# Patient Record
Sex: Female | Born: 2000 | Race: White | Hispanic: No | Marital: Single | Smoking: Never smoker
Health system: Southern US, Community
[De-identification: ages and names within clinical notes are randomized; demographics above are authoritative.]

## PROBLEM LIST (undated history)

## (undated) DIAGNOSIS — K219 Gastro-esophageal reflux disease without esophagitis: Secondary | ICD-10-CM

## (undated) DIAGNOSIS — Z789 Other specified health status: Secondary | ICD-10-CM

## (undated) DIAGNOSIS — Z87442 Personal history of urinary calculi: Secondary | ICD-10-CM

## (undated) HISTORY — PX: NO PAST SURGERIES: SHX2092

---
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
## 2023-06-04 ENCOUNTER — Emergency Department (HOSPITAL_COMMUNITY)
Admission: EM | Admit: 2023-06-04 | Discharge: 2023-06-05 | Disposition: A | Discharge status: Home / Self Care | Payer: Medicaid Other | Attending: Emergency Medicine | Admitting: Emergency Medicine

## 2023-06-04 ENCOUNTER — Encounter (HOSPITAL_COMMUNITY): Payer: Self-pay

## 2023-06-04 ENCOUNTER — Emergency Department (HOSPITAL_COMMUNITY): Payer: Medicaid Other

## 2023-06-04 ENCOUNTER — Other Ambulatory Visit: Payer: Self-pay

## 2023-06-04 DIAGNOSIS — R109 Unspecified abdominal pain: Secondary | ICD-10-CM | POA: Diagnosis not present

## 2023-06-04 DIAGNOSIS — S40212A Abrasion of left shoulder, initial encounter: Secondary | ICD-10-CM | POA: Insufficient documentation

## 2023-06-04 DIAGNOSIS — Y92410 Unspecified street and highway as the place of occurrence of the external cause: Secondary | ICD-10-CM | POA: Diagnosis not present

## 2023-06-04 DIAGNOSIS — S40912A Unspecified superficial injury of left shoulder, initial encounter: Secondary | ICD-10-CM | POA: Diagnosis present

## 2023-06-04 DIAGNOSIS — S60512A Abrasion of left hand, initial encounter: Secondary | ICD-10-CM | POA: Diagnosis not present

## 2023-06-04 LAB — I-STAT CREATININE, ED: Creatinine, Ser: 0.7 mg/dL (ref 0.44–1.00)

## 2023-06-04 LAB — HCG, SERUM, QUALITATIVE: Preg, Serum: NEGATIVE
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
## 2023-06-05 MED ORDER — CYCLOBENZAPRINE HCL 10 MG PO TABS: 10.0000 mg | ORAL_TABLET | Freq: Three times a day (TID) | ORAL | 0 refills | Status: DC | PRN

## 2023-06-05 MED ORDER — IBUPROFEN 600 MG PO TABS: 600.0000 mg | ORAL_TABLET | Freq: Four times a day (QID) | ORAL | 0 refills | Status: DC | PRN

## 2023-06-05 MED ORDER — IOHEXOL 350 MG/ML SOLN
75.0000 mL | Freq: Once | INTRAVENOUS | Status: AC | PRN
  Administered 2023-06-05: 75 mL via INTRAVENOUS
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
## 2024-05-05 ENCOUNTER — Ambulatory Visit
Admission: RE | Admit: 2024-05-05 | Discharge: 2024-05-05 | Disposition: A | Discharge status: Home / Self Care | Source: Ambulatory Visit | Attending: Nurse Practitioner | Admitting: Nurse Practitioner

## 2024-05-05 VITALS — BP 147/84 | HR 70 | Temp 98.7°F | Resp 18 | Wt 184.5 lb

## 2024-05-05 DIAGNOSIS — N3000 Acute cystitis without hematuria: Secondary | ICD-10-CM

## 2024-05-05 DIAGNOSIS — R112 Nausea with vomiting, unspecified: Secondary | ICD-10-CM | POA: Diagnosis not present

## 2024-05-05 DIAGNOSIS — R8290 Unspecified abnormal findings in urine: Secondary | ICD-10-CM | POA: Diagnosis not present

## 2024-05-05 DIAGNOSIS — R1013 Epigastric pain: Secondary | ICD-10-CM

## 2024-05-05 LAB — POCT URINALYSIS DIP (MANUAL ENTRY)
Bilirubin, UA: NEGATIVE
Glucose, UA: NEGATIVE mg/dL
Nitrite, UA: POSITIVE — AB
Protein Ur, POC: 30 mg/dL — AB
Spec Grav, UA: 1.02 (ref 1.010–1.025)
Urobilinogen, UA: 0.2 U/dL
pH, UA: 6.5 (ref 5.0–8.0)

## 2024-05-05 LAB — POCT URINE PREGNANCY: Preg Test, Ur: NEGATIVE

## 2024-05-05 MED ORDER — DICYCLOMINE HCL 20 MG PO TABS: 20.0000 mg | ORAL_TABLET | Freq: Four times a day (QID) | ORAL | 0 refills | Status: AC | PRN

## 2024-05-05 MED ORDER — PROMETHAZINE HCL 25 MG PO TABS: 25.0000 mg | ORAL_TABLET | Freq: Four times a day (QID) | ORAL | 0 refills | Status: AC | PRN

## 2024-05-05 MED ORDER — NITROFURANTOIN MONOHYD MACRO 100 MG PO CAPS: 100.0000 mg | ORAL_CAPSULE | Freq: Two times a day (BID) | ORAL | 0 refills | Status: DC

## 2024-05-05 NOTE — Discharge Instructions (Addendum)
 You were seen today for nausea, vomiting, abdominal pain, and bloating that have been progressively worsening over the past 1.5 to 2 months. Your urine test showed signs of a urinary tract infection (UTI), even though you do not have typical symptoms like burning or frequent urination. You were prescribed Macrobid  to take twice a day for 5 days. If nausea continues, you may take your antibiotic with applesauce to make it easier to tolerate. Promethazine  was prescribed to take as needed for nausea, and Bentyl  was prescribed for abdominal cramping.  Continue drinking plenty of fluids such as water, Gatorade, and ginger ale to stay hydrated. Follow a bland diet with foods like saltines, bananas, applesauce, or plain rice as tolerated. Your urine sample was sent for culture, and your blood was drawn to check for other possible causes. Results will be posted to your MyChart account, and you will receive a call if any treatment changes are needed.  Follow up with your primary care provider if your symptoms do not improve over the next few days or if you have questions about your lab results.   Go to the emergency room if you develop a high fever, worsening abdominal pain, persistent vomiting, signs of dehydration such as dizziness or dry mouth, or if you are unable to keep down fluids.

## 2024-05-05 NOTE — ED Triage Notes (Signed)
 Abdominal Pain, Nausea, Vomiting, Weight Loss, Food Aversion, Chills, Fatigue - Entered by patient  Pt c/o sxs listed x 1 month. Pt says she has been in bed since last Wednesday. Pt reports abdominal pain in center abdominal and URQ. Pt says she is vomiting every time she eats for the last month which is making her not want to eat at all. Pt is also having a tough time regulating her body temp. Lastly, pt reports her BM's have been green for 2 weeks.

## 2024-05-05 NOTE — ED Provider Notes (Signed)
 [facility]    CSN: [id number] Arrival date & time: 05/05/24  1725      History   Chief Complaint Chief Complaint  Patient presents with   Abdominal Pain    Abdominal Pain, Nausea, Vomiting, Weight Loss, Food Aversion, Chills, Fatigue - Entered by patient    HPI Alexis Crawford is a 23 y.o. female.   Discussed the use of AI scribe software for clinical note transcription with the patient, who gave verbal consent to proceed.   The patient presents with a 1.5 to 70-month history of progressively worsening nausea, vomiting, and abdominal pain, which has significantly intensified over the past two weeks. She reports experiencing extreme nausea throughout the day, with increased severity in the mornings and evenings. The patient has been vomiting frequently, with today being the first day without emesis in some time. She notes significant bloating after eating for the past two months.  The patient describes her abdominal pain as localized to the upper right quadrant, feeling harder in this area compared to the rest of her abdomen. She has experienced substantial weight loss, reporting a 25-pound decrease over the past 1.5 to 2 months, with 5 pounds lost in the past week alone. The patient has developed significant food aversions and has been subsisting on minimal intake, including saltines and small amounts of fruit. She has been consuming large quantities of water, ginger ale, and Gatorade in an attempt to stay hydrated.  The patient reports frequent headaches, which she describes as massive but notes these are typical for her. She denies any urinary symptoms such as dysuria or increased frequency beyond what is expected given her high fluid intake. The patient also denies chest pain, shortness of breath, palpitations, body aches, or dizziness. She mentions her stool has been consistently green for the past week or two, despite not having taken iron supplements recently.  The  patient has attempted various self-management strategies, including stress reduction, dietary changes, and altering sleep habits, but these efforts have not alleviated her symptoms. She has been bedridden since Wednesday morning, having taken time off work due to the severity of her condition.  The following portions of the patient's history were reviewed and updated as appropriate: allergies, current medications, past family history, past medical history, past social history, past surgical history, and problem list.        History reviewed. No pertinent past medical history.  There are no active problems to display for this patient.   History reviewed. No pertinent surgical history.  OB History   No obstetric history on file.      Home Medications    Prior to Admission medications   Medication Sig Start Date End Date Taking? Authorizing Provider  dicyclomine  (BENTYL ) 20 MG tablet Take 1 tablet (20 mg total) by mouth 4 (four) times daily as needed (abdominal discomfort). 05/05/24  Yes Shandel Busic, Lucie, FNP  nitrofurantoin , macrocrystal-monohydrate, (MACROBID ) 100 MG capsule Take 1 capsule (100 mg total) by mouth 2 (two) times daily. 05/05/24  Yes Zamarah Ullmer, FNP  promethazine  (PHENERGAN ) 25 MG tablet Take 1 tablet (25 mg total) by mouth every 6 (six) hours as needed for nausea or vomiting. 05/05/24  Yes Iola Lucie, FNP    Family History History reviewed. No pertinent family history.  Social History Social History   Tobacco Use   Smoking status: Never    Passive exposure: Current   Smokeless tobacco: Never  Vaping Use   Vaping status: Former  Substance Use Topics  Drug use: Never     Allergies   Patient has no known allergies.   Review of Systems Review of Systems  Constitutional:  Positive for appetite change, chills, fatigue and unexpected weight change. Negative for fever.  HENT: Negative.    Respiratory:  Negative for shortness of breath.    Cardiovascular:  Negative for chest pain and palpitations.  Gastrointestinal:  Positive for abdominal distention, abdominal pain, nausea and vomiting.  Genitourinary:  Negative for dysuria, frequency, menstrual problem (LMP around the 22nd of June) and vaginal discharge.  Musculoskeletal:  Negative for myalgias.  Neurological:  Positive for headaches. Negative for dizziness.  All other systems reviewed and are negative.    Physical Exam Triage Vital Signs ED Triage Vitals  Encounter Vitals Group     BP 05/05/24 1809 (!) 147/84     Girls Systolic BP Percentile --      Girls Diastolic BP Percentile --      Boys Systolic BP Percentile --      Boys Diastolic BP Percentile --      Pulse Rate 05/05/24 1809 70     Resp 05/05/24 1809 18     Temp 05/05/24 1809 98.7 F (37.1 C)     Temp Source 05/05/24 1809 Oral     SpO2 05/05/24 1809 98 %     Weight 05/05/24 1807 115 lb 15.4 oz (52.6 kg)     Height --      Head Circumference --      Peak Flow --      Pain Score 05/05/24 1806 6     Pain Loc --      Pain Education --      Exclude from Growth Chart --    No data found.  Updated Vital Signs BP (!) 147/84 (BP Location: Left Arm)   Pulse 70   Temp 98.7 F (37.1 C) (Oral)   Resp 18   Wt 115 lb 15.4 oz (52.6 kg)   LMP 04/25/2024 (Exact Date)   SpO2 98%   BMI 16.64 kg/m   Visual Acuity Right Eye Distance:   Left Eye Distance:   Bilateral Distance:    Right Eye Near:   Left Eye Near:    Bilateral Near:     Physical Exam Vitals reviewed.  Constitutional:      General: She is awake. She is not in acute distress.    Appearance: Normal appearance. She is well-developed. She is not ill-appearing, toxic-appearing or diaphoretic.  HENT:     Head: Normocephalic.     Right Ear: Hearing normal.     Left Ear: Hearing normal.     Nose: Nose normal.     Mouth/Throat:     Mouth: Mucous membranes are moist.  Eyes:     General: Vision grossly intact.     Conjunctiva/sclera:  Conjunctivae normal.  Cardiovascular:     Rate and Rhythm: Normal rate and regular rhythm.     Heart sounds: Normal heart sounds.  Pulmonary:     Effort: Pulmonary effort is normal.     Breath sounds: Normal breath sounds and air entry.  Abdominal:     General: Bowel sounds are normal. There is no distension.     Palpations: Abdomen is soft.     Tenderness: There is no abdominal tenderness. There is no right CVA tenderness or left CVA tenderness.  Musculoskeletal:        General: Normal range of motion.     Cervical back: Full passive  range of motion without pain, normal range of motion and neck supple.  Skin:    General: Skin is warm and dry.  Neurological:     General: No focal deficit present.     Mental Status: She is alert and oriented to person, place, and time.  Psychiatric:        Speech: Speech normal.        Behavior: Behavior is cooperative.      UC Treatments / Results  Labs (all labs ordered are listed, but only abnormal results are displayed) Labs Reviewed  POCT URINALYSIS DIP (MANUAL ENTRY) - Abnormal; Notable for the following components:      Result Value   Clarity, UA cloudy (*)    Ketones, POC UA trace (5) (*)    Blood, UA trace-intact (*)    Protein Ur, POC =30 (*)    Nitrite, UA Positive (*)    Leukocytes, UA Large (3+) (*)    All other components within normal limits  POCT URINE PREGNANCY - Normal  URINE CULTURE  CBC WITH DIFFERENTIAL/PLATELET  COMPREHENSIVE METABOLIC PANEL WITH GFR  LIPASE    EKG   Radiology No results found.  Procedures Procedures (including critical care time)  Medications Ordered in UC Medications - No data to display  Initial Impression / Assessment and Plan / UC Course  I have reviewed the triage vital signs and the nursing notes.  Pertinent labs & imaging results that were available during my care of the patient were reviewed by me and considered in my medical decision making (see chart for details).      Patient presents with a 1.5- to 23-month history of progressively worsening nausea, vomiting, abdominal pain, and bloating. Urinalysis reveals a large urinary tract infection, evidenced by leukocytes, nitrites, and cloudy appearance, despite the patient denying classic UTI symptoms such as dysuria or increased frequency. Family history of atypical UTI presentations is noted. Although the patient reports significant fluid intake, urinalysis shows no signs of clinical dehydration, with no ketones and normal specific gravity. Macrobid  was prescribed twice daily for 5 days to treat the UTI. Promethazine  was prescribed as needed for nausea, and Bentyl  was prescribed for abdominal cramping. Blood work including CBC, CMP, and lipase was obtained to evaluate for other potential causes of symptoms. Urine was sent for culture and sensitivity to guide therapy, and patient was advised to follow results on MyChart or expect a phone call if treatment adjustments are needed. Supportive care includes continued oral hydration with water, Gatorade, and ginger ale, and a bland diet with saltines and fruit as tolerated. Follow up with PCP if symptoms persist or worsen. Return to the emergency department for high fever, persistent vomiting, worsening abdominal pain, or signs of dehydration.  Today's evaluation has revealed no signs of a dangerous process. Discussed diagnosis with patient and/or guardian. Patient and/or guardian aware of their diagnosis, possible red flag symptoms to watch out for and need for close follow up. Patient and/or guardian understands verbal and written discharge instructions. Patient and/or guardian comfortable with plan and disposition.  Patient and/or guardian has a clear mental status at this time, good insight into illness (after discussion and teaching) and has clear judgment to make decisions regarding their care  Documentation was completed with the aid of voice recognition software.  Transcription may contain typographical errors.  Final Clinical Impressions(s) / UC Diagnoses   Final diagnoses:  Acute cystitis without hematuria  Asymptomatic urinary finding  Nausea and vomiting, unspecified vomiting type  Epigastric pain     Discharge Instructions      You were seen today for nausea, vomiting, abdominal pain, and bloating that have been progressively worsening over the past 1.5 to 2 months. Your urine test showed signs of a urinary tract infection (UTI), even though you do not have typical symptoms like burning or frequent urination. You were prescribed Macrobid  to take twice a day for 5 days. If nausea continues, you may take your antibiotic with applesauce to make it easier to tolerate. Promethazine  was prescribed to take as needed for nausea, and Bentyl  was prescribed for abdominal cramping.  Continue drinking plenty of fluids such as water, Gatorade, and ginger ale to stay hydrated. Follow a bland diet with foods like saltines, bananas, applesauce, or plain rice as tolerated. Your urine sample was sent for culture, and your blood was drawn to check for other possible causes. Results will be posted to your MyChart account, and you will receive a call if any treatment changes are needed.  Follow up with your primary care provider if your symptoms do not improve over the next few days or if you have questions about your lab results.   Go to the emergency room if you develop a high fever, worsening abdominal pain, persistent vomiting, signs of dehydration such as dizziness or dry mouth, or if you are unable to keep down fluids.      ED Prescriptions     Medication Sig Dispense Auth. Provider   nitrofurantoin , macrocrystal-monohydrate, (MACROBID ) 100 MG capsule Take 1 capsule (100 mg total) by mouth 2 (two) times daily. 10 capsule Iola Lukes, FNP   promethazine  (PHENERGAN ) 25 MG tablet Take 1 tablet (25 mg total) by mouth every 6 (six) hours as needed for  nausea or vomiting. 30 tablet Iola Lukes, FNP   dicyclomine  (BENTYL ) 20 MG tablet Take 1 tablet (20 mg total) by mouth 4 (four) times daily as needed (abdominal discomfort). 20 tablet Iola Lukes, FNP      PDMP not reviewed this encounter.   Iola Lukes, [place] 05/05/24 1932

## 2024-05-06 LAB — COMPREHENSIVE METABOLIC PANEL WITH GFR
ALT: 18 IU/L (ref 0–32)
AST: 13 IU/L (ref 0–40)
Albumin: 5 g/dL (ref 4.0–5.0)
Alkaline Phosphatase: 84 IU/L (ref 42–106)
BUN/Creatinine Ratio: 8 — ABNORMAL LOW (ref 9–23)
BUN: 7 mg/dL (ref 6–20)
Bilirubin Total: 0.8 mg/dL (ref 0.0–1.2)
CO2: 22 mmol/L (ref 20–29)
Calcium: 9.9 mg/dL (ref 8.7–10.2)
Chloride: 102 mmol/L (ref 96–106)
Creatinine, Ser: 0.83 mg/dL (ref 0.57–1.00)
Globulin, Total: 2.7 g/dL (ref 1.5–4.5)
Glucose: 91 mg/dL (ref 70–99)
Potassium: 4.1 mmol/L (ref 3.5–5.2)
Sodium: 140 mmol/L (ref 134–144)
Total Protein: 7.7 g/dL (ref 6.0–8.5)
eGFR: 103 mL/min/1.73 (ref >59)

## 2024-05-06 LAB — CBC WITH DIFFERENTIAL/PLATELET
Basophils Absolute: 0.1 x10E3/uL (ref 0.0–0.2)
Basos: 0 %
EOS (ABSOLUTE): 0.2 x10E3/uL (ref 0.0–0.4)
Eos: 1 %
Hematocrit: 42.7 % (ref 34.0–46.6)
Hemoglobin: 13.7 g/dL (ref 11.1–15.9)
Immature Grans (Abs): 0 x10E3/uL (ref 0.0–0.1)
Immature Granulocytes: 0 %
Lymphocytes Absolute: 2.2 x10E3/uL (ref 0.7–3.1)
Lymphs: 17 %
MCH: 29.7 pg (ref 26.6–33.0)
MCHC: 32.1 g/dL (ref 31.5–35.7)
MCV: 93 fL (ref 79–97)
Monocytes Absolute: 0.9 x10E3/uL (ref 0.1–0.9)
Monocytes: 7 %
Neutrophils Absolute: 9.6 x10E3/uL — ABNORMAL HIGH (ref 1.4–7.0)
Neutrophils: 75 %
Platelets: 398 x10E3/uL (ref 150–450)
RBC: 4.61 x10E6/uL (ref 3.77–5.28)
RDW: 13.7 % (ref 11.7–15.4)
WBC: 13 x10E3/uL — ABNORMAL HIGH (ref 3.4–10.8)

## 2024-05-06 LAB — LIPASE: Lipase: 15 U/L (ref 14–72)

## 2024-05-07 LAB — URINE CULTURE: Culture: >=100000 — AB

## 2024-05-08 ENCOUNTER — Ambulatory Visit (HOSPITAL_COMMUNITY): Payer: Self-pay

## 2024-05-14 ENCOUNTER — Ambulatory Visit
Admission: RE | Admit: 2024-05-14 | Discharge: 2024-05-14 | Disposition: A | Discharge status: Home / Self Care | Source: Ambulatory Visit | Attending: Family Medicine | Admitting: Family Medicine

## 2024-05-14 VITALS — BP 151/95 | HR 67 | Temp 98.0°F | Resp 16 | Wt 184.5 lb

## 2024-05-14 DIAGNOSIS — R1013 Epigastric pain: Secondary | ICD-10-CM

## 2024-05-14 DIAGNOSIS — R82998 Other abnormal findings in urine: Secondary | ICD-10-CM | POA: Diagnosis not present

## 2024-05-14 LAB — POCT URINALYSIS DIP (MANUAL ENTRY)
Bilirubin, UA: NEGATIVE
Blood, UA: NEGATIVE
Glucose, UA: NEGATIVE mg/dL
Ketones, POC UA: NEGATIVE mg/dL
Nitrite, UA: NEGATIVE
Protein Ur, POC: NEGATIVE mg/dL
Spec Grav, UA: 1.015 (ref 1.010–1.025)
Urobilinogen, UA: 0.2 U/dL
pH, UA: 7.5 (ref 5.0–8.0)

## 2024-05-14 LAB — POCT URINE PREGNANCY: Preg Test, Ur: NEGATIVE

## 2024-05-14 LAB — POCT FASTING CBG KUC MANUAL ENTRY: POCT Glucose (KUC): 104 mg/dL — AB (ref 70–99)

## 2024-05-14 MED ORDER — LIDOCAINE VISCOUS HCL 2 % MT SOLN
15.0000 mL | Freq: Once | OROMUCOSAL | Status: AC
  Administered 2024-05-14: 15 mL via OROMUCOSAL

## 2024-05-14 MED ORDER — FAMOTIDINE 40 MG PO TABS: 40.0000 mg | ORAL_TABLET | Freq: Every day | ORAL | 2 refills | Status: AC

## 2024-05-14 MED ORDER — ALUM & MAG HYDROXIDE-SIMETH 200-200-20 MG/5ML PO SUSP
30.0000 mL | Freq: Once | ORAL | Status: AC
  Administered 2024-05-14: 30 mL via ORAL

## 2024-05-14 MED ORDER — ONDANSETRON HCL 4 MG/2ML IJ SOLN
4.0000 mg | Freq: Once | INTRAMUSCULAR | Status: AC
  Administered 2024-05-14: 4 mg via INTRAMUSCULAR

## 2024-05-14 NOTE — ED Triage Notes (Signed)
 Pt presents c/o nausea, abdominal pain, and emesis. Pt says only when she took meds previously prescribed for nausea and pain is when she can eat. Pt still having a tough time eating. Sxs onset a little over a month ago.

## 2024-05-14 NOTE — Discharge Instructions (Signed)
 You have had labs (blood tests) sent today. We will call you with any significant abnormalities or if there is need to begin or change treatment or pursue further follow up.  You may also review your test results online through MyChart. If you do not have a MyChart account, instructions to sign up should be on your discharge paperwork.  You have been seen today for abdominal pain. Your evaluation was not suggestive of any emergent condition requiring medical intervention at this time. However, some abdominal problems make take more time to appear. Therefore, it is very important for you to pay attention to any new symptoms or worsening of your current condition.  Please return here or to the Emergency Department immediately should you begin to feel worse in any way or have any of the following symptoms: increasing or different abdominal pain, persistent vomiting, inability to drink fluids, fevers, or shaking chills.

## 2024-05-15 LAB — CBC WITH DIFFERENTIAL/PLATELET
Basophils Absolute: 0.1 x10E3/uL (ref 0.0–0.2)
Basos: 1 %
EOS (ABSOLUTE): 0.1 x10E3/uL (ref 0.0–0.4)
Eos: 1 %
Hematocrit: 41.8 % (ref 34.0–46.6)
Hemoglobin: 13.3 g/dL (ref 11.1–15.9)
Immature Grans (Abs): 0 x10E3/uL (ref 0.0–0.1)
Immature Granulocytes: 0 %
Lymphocytes Absolute: 1.9 x10E3/uL (ref 0.7–3.1)
Lymphs: 18 %
MCH: 29.5 pg (ref 26.6–33.0)
MCHC: 31.8 g/dL (ref 31.5–35.7)
MCV: 93 fL (ref 79–97)
Monocytes Absolute: 0.6 x10E3/uL (ref 0.1–0.9)
Monocytes: 5 %
Neutrophils Absolute: 7.9 x10E3/uL — ABNORMAL HIGH (ref 1.4–7.0)
Neutrophils: 75 %
Platelets: 311 x10E3/uL (ref 150–450)
RBC: 4.51 x10E6/uL (ref 3.77–5.28)
RDW: 13.7 % (ref 11.7–15.4)
WBC: 10.5 x10E3/uL (ref 3.4–10.8)

## 2024-05-15 LAB — COMPREHENSIVE METABOLIC PANEL WITH GFR
ALT: 15 IU/L (ref 0–32)
AST: 17 IU/L (ref 0–40)
Albumin: 4.8 g/dL (ref 4.0–5.0)
Alkaline Phosphatase: 82 IU/L (ref 42–106)
BUN/Creatinine Ratio: 5 — ABNORMAL LOW (ref 9–23)
BUN: 4 mg/dL — ABNORMAL LOW (ref 6–20)
Bilirubin Total: 0.6 mg/dL (ref 0.0–1.2)
CO2: 23 mmol/L (ref 20–29)
Calcium: 9.8 mg/dL (ref 8.7–10.2)
Chloride: 103 mmol/L (ref 96–106)
Creatinine, Ser: 0.81 mg/dL (ref 0.57–1.00)
Globulin, Total: 2.6 g/dL (ref 1.5–4.5)
Glucose: 96 mg/dL (ref 70–99)
Potassium: 3.8 mmol/L (ref 3.5–5.2)
Sodium: 140 mmol/L (ref 134–144)
Total Protein: 7.4 g/dL (ref 6.0–8.5)
eGFR: 107 mL/min/1.73 (ref >59)

## 2024-05-15 LAB — URINE CULTURE: Culture: NO GROWTH

## 2024-05-15 NOTE — ED Provider Notes (Signed)
 [facility]   [id number] 05/14/24 Arrival Time: 1235  ASSESSMENT & PLAN:  1. Epigastric pain   2. Urine leukocytes    Much better after GI cocktail. Likely gastric issue/GERD; discussed. Benign abdominal exam. No indications for urgent abdominal/pelvic imaging at this time.   Meds ordered this encounter  Medications   alum & mag hydroxide-simeth (MAALOX/MYLANTA) 200-200-20 MG/5ML suspension 30 mL   lidocaine  (XYLOCAINE ) 2 % viscous mouth solution 15 mL   ondansetron  (ZOFRAN ) injection 4 mg   famotidine  (PEPCID ) 40 MG tablet    Sig: Take 1 tablet (40 mg total) by mouth daily.    Dispense:  30 tablet    Refill:  2   Results for orders placed or performed during the hospital encounter of 05/14/24  POCT CBG (manual entry)   Collection Time: 05/14/24  1:50 PM  Result Value Ref Range   POCT Glucose ([facility]) 104 (A) 70 - 99 mg/dL  POCT urinalysis dipstick   Collection Time: 05/14/24  1:57 PM  Result Value Ref Range   Color, UA yellow yellow   Clarity, UA hazy (A) clear   Glucose, UA negative negative mg/dL   Bilirubin, UA negative negative   Ketones, POC UA negative negative mg/dL   Spec Grav, UA 8.984 8.989 - 1.025   Blood, UA negative negative   pH, UA 7.5 5.0 - 8.0   Protein Ur, POC negative negative mg/dL   Urobilinogen, UA 0.2 0.2 or 1.0 E.U./dL   Nitrite, UA Negative Negative   Leukocytes, UA Trace (A) Negative  POCT urine pregnancy   Collection Time: 05/14/24  1:57 PM  Result Value Ref Range   Preg Test, Ur Negative Negative  CBC with Differential/Platelet   Collection Time: 05/14/24  3:38 PM  Result Value Ref Range   WBC 10.5 3.4 - 10.8 x10E3/uL   RBC 4.51 3.77 - 5.28 x10E6/uL   Hemoglobin 13.3 11.1 - 15.9 g/dL   Hematocrit 58.1 65.9 - 46.6 %   MCV 93 79 - 97 fL   MCH 29.5 26.6 - 33.0 pg   MCHC 31.8 31.5 - 35.7 g/dL   RDW 86.2 88.2 - 84.5 %   Platelets 311 150 - 450 x10E3/uL   Neutrophils 75 Not Estab. %   Lymphs 18 Not Estab. %   Monocytes 5  Not Estab. %   Eos 1 Not Estab. %   Basos 1 Not Estab. %   Neutrophils Absolute 7.9 (H) 1.4 - 7.0 x10E3/uL   Lymphocytes Absolute 1.9 0.7 - 3.1 x10E3/uL   Monocytes Absolute 0.6 0.1 - 0.9 x10E3/uL   EOS (ABSOLUTE) 0.1 0.0 - 0.4 x10E3/uL   Basophils Absolute 0.1 0.0 - 0.2 x10E3/uL   Immature Granulocytes 0 Not Estab. %   Immature Grans (Abs) 0.0 0.0 - 0.1 x10E3/uL  Comprehensive metabolic panel with GFR   Collection Time: 05/14/24  3:38 PM  Result Value Ref Range   Glucose 96 70 - 99 mg/dL   BUN 4 (L) 6 - 20 mg/dL   Creatinine, Ser 9.18 0.57 - 1.00 mg/dL   eGFR 892 >40 fO/fpw/8.26   BUN/Creatinine Ratio 5 (L) 9 - 23   Sodium 140 134 - 144 mmol/L   Potassium 3.8 3.5 - 5.2 mmol/L   Chloride 103 96 - 106 mmol/L   CO2 23 20 - 29 mmol/L   Calcium 9.8 8.7 - 10.2 mg/dL   Total Protein 7.4 6.0 - 8.5 g/dL   Albumin 4.8 4.0 - 5.0 g/dL   Globulin, Total 2.6  1.5 - 4.5 g/dL   Bilirubin Total 0.6 0.0 - 1.2 mg/dL   Alkaline Phosphatase 82 42 - 106 IU/L   AST 17 0 - 40 IU/L   ALT 15 0 - 32 IU/L      Discharge Instructions      You have had labs (blood tests) sent today. We will call you with any significant abnormalities or if there is need to begin or change treatment or pursue further follow up.  You may also review your test results online through MyChart. If you do not have a MyChart account, instructions to sign up should be on your discharge paperwork.  You have been seen today for abdominal pain. Your evaluation was not suggestive of any emergent condition requiring medical intervention at this time. However, some abdominal problems make take more time to appear. Therefore, it is very important for you to pay attention to any new symptoms or worsening of your current condition.  Please return here or to the Emergency Department immediately should you begin to feel worse in any way or have any of the following symptoms: increasing or different abdominal pain, persistent vomiting,  inability to drink fluids, fevers, or shaking chills.       Follow-up Information     [facility].   Specialty: Emergency Medicine Why: If symptoms worsen in any way. Contact information: [address] [place]  [address] [phone]                Reviewed expectations re: course of current medical issues. Questions answered. Outlined signs and symptoms indicating need for more acute intervention. Patient verbalized understanding. After Visit Summary given.   SUBJECTIVE: History from: patient. Pamela V Frankowski is a 23 y.o. female who presents with complaint of nausea, epigastric abdominal pain, and emesis; on/off x 6 months. Recent tx for pyelo. Denies fever. Pt says only when she took meds previously prescribed for nausea and pain is when she can eat. Pt still having a tough time eating.  Denies diarrhea.    Patient's last menstrual period was 04/25/2024 (exact date). History reviewed. No pertinent surgical history.   OBJECTIVE:  Vitals:   05/14/24 1322  BP: (!) 151/95  Pulse: 67  Resp: 16  Temp: 98 F (36.7 C)  TempSrc: Oral  SpO2: 97%  Weight: 83.7 kg    General appearance: alert, oriented, no acute distress HEENT: [place]; AT; oropharynx moist Lungs: unlabored respirations Abdomen: soft; without distention; mild soreness around epigastric abdomen; normal bowel sounds; without masses or organomegaly; without guarding or rebound tenderness Back: without reported CVA tenderness; FROM at waist Extremities: without LE edema; symmetrical; without gross deformities Skin: warm and dry Neurologic: normal gait Psychological: alert and cooperative; normal mood and affect  Labs: Results for orders placed or performed during the hospital encounter of 05/14/24  POCT CBG (manual entry)   Collection Time: 05/14/24  1:50 PM  Result Value Ref Range   POCT Glucose ([facility]) 104 (A) 70 - 99 mg/dL  POCT  urinalysis dipstick   Collection Time: 05/14/24  1:57 PM  Result Value Ref Range   Color, UA yellow yellow   Clarity, UA hazy (A) clear   Glucose, UA negative negative mg/dL   Bilirubin, UA negative negative   Ketones, POC UA negative negative mg/dL   Spec Grav, UA 8.984 8.989 - 1.025   Blood, UA negative negative   pH, UA 7.5 5.0 - 8.0   Protein Ur, POC negative  negative mg/dL   Urobilinogen, UA 0.2 0.2 or 1.0 E.U./dL   Nitrite, UA Negative Negative   Leukocytes, UA Trace (A) Negative  POCT urine pregnancy   Collection Time: 05/14/24  1:57 PM  Result Value Ref Range   Preg Test, Ur Negative Negative  CBC with Differential/Platelet   Collection Time: 05/14/24  3:38 PM  Result Value Ref Range   WBC 10.5 3.4 - 10.8 x10E3/uL   RBC 4.51 3.77 - 5.28 x10E6/uL   Hemoglobin 13.3 11.1 - 15.9 g/dL   Hematocrit 58.1 65.9 - 46.6 %   MCV 93 79 - 97 fL   MCH 29.5 26.6 - 33.0 pg   MCHC 31.8 31.5 - 35.7 g/dL   RDW 86.2 88.2 - 84.5 %   Platelets 311 150 - 450 x10E3/uL   Neutrophils 75 Not Estab. %   Lymphs 18 Not Estab. %   Monocytes 5 Not Estab. %   Eos 1 Not Estab. %   Basos 1 Not Estab. %   Neutrophils Absolute 7.9 (H) 1.4 - 7.0 x10E3/uL   Lymphocytes Absolute 1.9 0.7 - 3.1 x10E3/uL   Monocytes Absolute 0.6 0.1 - 0.9 x10E3/uL   EOS (ABSOLUTE) 0.1 0.0 - 0.4 x10E3/uL   Basophils Absolute 0.1 0.0 - 0.2 x10E3/uL   Immature Granulocytes 0 Not Estab. %   Immature Grans (Abs) 0.0 0.0 - 0.1 x10E3/uL  Comprehensive metabolic panel with GFR   Collection Time: 05/14/24  3:38 PM  Result Value Ref Range   Glucose 96 70 - 99 mg/dL   BUN 4 (L) 6 - 20 mg/dL   Creatinine, Ser 9.18 0.57 - 1.00 mg/dL   eGFR 892 >40 fO/fpw/8.26   BUN/Creatinine Ratio 5 (L) 9 - 23   Sodium 140 134 - 144 mmol/L   Potassium 3.8 3.5 - 5.2 mmol/L   Chloride 103 96 - 106 mmol/L   CO2 23 20 - 29 mmol/L   Calcium 9.8 8.7 - 10.2 mg/dL   Total Protein 7.4 6.0 - 8.5 g/dL   Albumin 4.8 4.0 - 5.0 g/dL   Globulin, Total  2.6 1.5 - 4.5 g/dL   Bilirubin Total 0.6 0.0 - 1.2 mg/dL   Alkaline Phosphatase 82 42 - 106 IU/L   AST 17 0 - 40 IU/L   ALT 15 0 - 32 IU/L   Labs Reviewed  CBC WITH DIFFERENTIAL/PLATELET - Abnormal; Notable for the following components:      Result Value   Neutrophils Absolute 7.9 (*)    All other components within normal limits   Narrative:    Performed at:  [address] [facility] [address] [place], [place]  [id number] Lab Director: Frankey Sas MD, Phone:  [phone]  COMPREHENSIVE METABOLIC PANEL WITH GFR - Abnormal; Notable for the following components:   BUN 4 (*)    BUN/Creatinine Ratio 5 (*)    All other components within normal limits   Narrative:    Performed at:  [address] [facility] [address] [place], [place]  [id number] Lab Director: Frankey Sas MD, Phone:  [phone]  POCT URINALYSIS DIP (MANUAL ENTRY) - Abnormal; Notable for the following components:   Clarity, UA hazy (*)    Leukocytes, UA Trace (*)    All other components within normal limits  POCT FASTING CBG [facility] MANUAL ENTRY - Abnormal; Notable for the following components:   POCT Glucose ([facility]) 104 (*)    All other components within normal limits  POCT URINE PREGNANCY - Normal  URINE CULTURE  LIPASE,  BLOOD    Imaging: No results found.   No Known Allergies                                             History reviewed. No pertinent past medical history.  Social History   Socioeconomic History   Marital status: Single    Spouse name: Not on file   Number of children: Not on file   Years of education: Not on file   Highest education level: Not on file  Occupational History   Not on file  Tobacco Use   Smoking status: Never    Passive exposure: Current   Smokeless tobacco: Never  Vaping Use   Vaping status: Former  Substance and Sexual Activity   Alcohol use: Not on file   Drug use: Never   Sexual activity: Not on file  Other Topics Concern   Not on file  Social History  Narrative   Not on file   Social Drivers of Health   Financial Resource Strain: Not on file  Food Insecurity: Not on file  Transportation Needs: Not on file  Physical Activity: Not on file  Stress: Not on file  Social Connections: Not on file  Intimate Partner Violence: Not on file    History reviewed. No pertinent family history.   Rolinda Rogue, MD 05/15/24 1006

## 2024-05-16 ENCOUNTER — Ambulatory Visit (HOSPITAL_COMMUNITY): Payer: Self-pay

## 2024-05-20 ENCOUNTER — Other Ambulatory Visit: Payer: Self-pay

## 2024-05-20 ENCOUNTER — Emergency Department (HOSPITAL_BASED_OUTPATIENT_CLINIC_OR_DEPARTMENT_OTHER)

## 2024-05-20 ENCOUNTER — Emergency Department (HOSPITAL_BASED_OUTPATIENT_CLINIC_OR_DEPARTMENT_OTHER): Admitting: Radiology

## 2024-05-20 ENCOUNTER — Emergency Department (HOSPITAL_BASED_OUTPATIENT_CLINIC_OR_DEPARTMENT_OTHER)
Admission: EM | Admit: 2024-05-20 | Discharge: 2024-05-20 | Disposition: A | Discharge status: Home / Self Care | Source: Ambulatory Visit | Attending: Emergency Medicine | Admitting: Emergency Medicine

## 2024-05-20 DIAGNOSIS — N3000 Acute cystitis without hematuria: Secondary | ICD-10-CM | POA: Diagnosis not present

## 2024-05-20 DIAGNOSIS — R112 Nausea with vomiting, unspecified: Secondary | ICD-10-CM | POA: Diagnosis present

## 2024-05-20 DIAGNOSIS — N2889 Other specified disorders of kidney and ureter: Secondary | ICD-10-CM | POA: Diagnosis not present

## 2024-05-20 LAB — URINALYSIS, ROUTINE W REFLEX MICROSCOPIC
Bilirubin Urine: NEGATIVE
Glucose, UA: NEGATIVE mg/dL
Hgb urine dipstick: NEGATIVE
Ketones, ur: NEGATIVE mg/dL
Nitrite: NEGATIVE
Protein, ur: NEGATIVE mg/dL
Specific Gravity, Urine: 1.011 (ref 1.005–1.030)
pH: 6.5 (ref 5.0–8.0)

## 2024-05-20 LAB — COMPREHENSIVE METABOLIC PANEL WITH GFR
ALT: 15 U/L (ref 0–44)
AST: 21 U/L (ref 15–41)
Albumin: 4.8 g/dL (ref 3.5–5.0)
Alkaline Phosphatase: 76 U/L (ref 38–126)
Anion gap: 14 (ref 5–15)
BUN: 9 mg/dL (ref 6–20)
CO2: 23 mmol/L (ref 22–32)
Calcium: 9.7 mg/dL (ref 8.9–10.3)
Chloride: 100 mmol/L (ref 98–111)
Creatinine, Ser: 0.87 mg/dL (ref 0.44–1.00)
GFR, Estimated: >60 mL/min (ref >60)
Glucose, Bld: 126 mg/dL — ABNORMAL HIGH (ref 70–99)
Potassium: 3.8 mmol/L (ref 3.5–5.1)
Sodium: 137 mmol/L (ref 135–145)
Total Bilirubin: 0.9 mg/dL (ref 0.0–1.2)
Total Protein: 7.8 g/dL (ref 6.5–8.1)

## 2024-05-20 LAB — CBC
HCT: 41.4 % (ref 36.0–46.0)
Hemoglobin: 13.7 g/dL (ref 12.0–15.0)
MCH: 29.7 pg (ref 26.0–34.0)
MCHC: 33.1 g/dL (ref 30.0–36.0)
MCV: 89.8 fL (ref 80.0–100.0)
Platelets: 294 K/uL (ref 150–400)
RBC: 4.61 MIL/uL (ref 3.87–5.11)
RDW: 13.5 % (ref 11.5–15.5)
WBC: 9.3 K/uL (ref 4.0–10.5)
nRBC: 0 % (ref 0.0–0.2)

## 2024-05-20 LAB — PREGNANCY, URINE: Preg Test, Ur: NEGATIVE

## 2024-05-20 LAB — LIPASE, BLOOD: Lipase: 13 U/L (ref 11–51)

## 2024-05-20 LAB — TROPONIN T, HIGH SENSITIVITY: Troponin T High Sensitivity: <15 ng/L (ref <19)

## 2024-05-20 MED ORDER — PANTOPRAZOLE SODIUM 20 MG PO TBEC
20.0000 mg | DELAYED_RELEASE_TABLET | Freq: Every day | ORAL | 0 refills | Status: AC
Start: 2024-05-20 — End: ?

## 2024-05-20 MED ORDER — CEPHALEXIN 500 MG PO CAPS: 500.0000 mg | ORAL_CAPSULE | Freq: Two times a day (BID) | ORAL | 0 refills | Status: AC

## 2024-05-20 MED ORDER — IOHEXOL 300 MG/ML  SOLN
100.0000 mL | Freq: Once | INTRAMUSCULAR | Status: AC | PRN
  Administered 2024-05-20: 85 mL via INTRAVENOUS

## 2024-05-20 MED ORDER — ONDANSETRON HCL 4 MG PO TABS: 4.0000 mg | ORAL_TABLET | Freq: Four times a day (QID) | ORAL | 0 refills | Status: DC

## 2024-05-20 NOTE — ED Triage Notes (Signed)
 C/o n/v and abd pain x 2 months. Seen at UC last week for same and was given meds but states no improvement.

## 2024-05-20 NOTE — ED Notes (Signed)
 Patient given discharge instructions. Questions were answered. Patient verbalized understanding of discharge instructions and care at home.

## 2024-05-20 NOTE — ED Provider Notes (Signed)
 [place] EMERGENCY DEPARTMENT AT [facility] Provider Note   CSN: [id number] Arrival date & time: 05/20/24  1226     Patient presents with: Abdominal Pain   Alexis Crawford is a 23 y.o. female.   Abdominal Pain Associated symptoms: nausea and vomiting   Patient is a 23 year old female presenting to ED today with multiple complaints.  Noting concerns for bilateral flank pain (worse on right side), right sided abdominal pain, nausea, vomiting x 2 months.  Previously seen by urgent care 3 times, last 1 being today and was told to come to the ED for imaging.  Initially treated for UTI with Macrobid  noting mild improvement in symptoms, however has noted worsening symptoms x 1 week with night sweats happening nightly as well as 1 near single episode yesterday happening after she stood up typically while at work, and worsening fatigue.  States she also had chronic intermittent chest pain, not worse with exertion and not pleuritic, that she describes as stabbing and only lasts for a few seconds at a time.  States that her epigastric pain worsens after meals.  Reporting vomiting episodes particularly worse in the morning.  LMP was 04/25/2024.  Is not sexually active.  Denies fever, headache, vision changes, shortness of breath, cough, congestion, hemoptysis, hematemesis, hematochezia, melena, diarrhea, dysuria, vaginal bleeding, vaginal discharge, vaginal itching/pain, lower leg swelling.  Denies alcohol use, chronic NSAID use, THC use or IVD use.    Prior to Admission medications   Medication Sig Start Date End Date Taking? Authorizing Provider  cephALEXin  (KEFLEX ) 500 MG capsule Take 1 capsule (500 mg total) by mouth 2 (two) times daily for 7 days. 05/20/24 05/27/24 Yes Marji Kuehnel S, PA-C  ondansetron  (ZOFRAN ) 4 MG tablet Take 1 tablet (4 mg total) by mouth every 6 (six) hours. 05/20/24  Yes Beola Terrall RAMAN, PA-C  pantoprazole  (PROTONIX ) 20 MG tablet Take 1 tablet (20 mg total) by  mouth daily. 05/20/24  Yes Greta Yung S, PA-C  dicyclomine  (BENTYL ) 20 MG tablet Take 1 tablet (20 mg total) by mouth 4 (four) times daily as needed (abdominal discomfort). 05/05/24   Iola Lukes, FNP  famotidine  (PEPCID ) 40 MG tablet Take 1 tablet (40 mg total) by mouth daily. 05/14/24   Rolinda Rogue, MD  nitrofurantoin , macrocrystal-monohydrate, (MACROBID ) 100 MG capsule Take 1 capsule (100 mg total) by mouth 2 (two) times daily. 05/05/24   Murrill, Samantha, FNP  promethazine  (PHENERGAN ) 25 MG tablet Take 1 tablet (25 mg total) by mouth every 6 (six) hours as needed for nausea or vomiting. 05/05/24   Murrill, Samantha, FNP    Allergies: Patient has no known allergies.    Review of Systems  Gastrointestinal:  Positive for abdominal pain, nausea and vomiting.  All other systems reviewed and are negative.   Updated Vital Signs BP (!) 131/99   Pulse 66   Temp 98.2 F (36.8 C) (Oral)   Resp 16   LMP 04/25/2024 (Exact Date)   SpO2 99%   Physical Exam Vitals and nursing note reviewed.  Constitutional:      General: She is not in acute distress.    Appearance: Normal appearance. She is not ill-appearing, toxic-appearing or diaphoretic.  HENT:     Head: Normocephalic and atraumatic.     Mouth/Throat:     Mouth: Mucous membranes are moist.     Pharynx: Oropharynx is clear. No pharyngeal swelling or oropharyngeal exudate.  Eyes:     General: No scleral icterus.    Extraocular Movements: Extraocular  movements intact.     Conjunctiva/sclera: Conjunctivae normal.     Pupils: Pupils are equal, round, and reactive to light.  Cardiovascular:     Rate and Rhythm: Normal rate and regular rhythm.     Pulses: Normal pulses.     Heart sounds: Normal heart sounds. No murmur heard.    No friction rub. No gallop.  Pulmonary:     Effort: Pulmonary effort is normal. No respiratory distress.     Breath sounds: Normal breath sounds. No stridor. No wheezing, rhonchi or rales.  Chest:     Chest  wall: No tenderness.  Abdominal:     General: Abdomen is flat. There is no distension.     Palpations: Abdomen is soft. There is no pulsatile mass.     Tenderness: There is abdominal tenderness in the right upper quadrant, right lower quadrant and epigastric area. There is right CVA tenderness and left CVA tenderness. There is no guarding or rebound. Positive signs include McBurney's sign. Negative signs include Murphy's sign and Rovsing's sign.     Hernia: No hernia is present.  Musculoskeletal:     Cervical back: Normal range of motion and neck supple. No rigidity.     Right lower leg: No edema.     Left lower leg: No edema.  Skin:    General: Skin is warm and dry.     Coloration: Skin is not mottled or pale.     Findings: No bruising, erythema or lesion.  Neurological:     General: No focal deficit present.     Mental Status: She is alert and oriented to person, place, and time. Mental status is at baseline.     Cranial Nerves: No cranial nerve deficit.     Sensory: No sensory deficit.     Motor: No weakness.  Psychiatric:        Mood and Affect: Mood normal. Mood is not anxious.     (all labs ordered are listed, but only abnormal results are displayed) Labs Reviewed  COMPREHENSIVE METABOLIC PANEL WITH GFR - Abnormal; Notable for the following components:      Result Value   Glucose, Bld 126 (*)    All other components within normal limits  URINALYSIS, ROUTINE W REFLEX MICROSCOPIC - Abnormal; Notable for the following components:   Leukocytes,Ua SMALL (*)    Bacteria, UA MANY (*)    All other components within normal limits  URINE CULTURE  LIPASE, BLOOD  CBC  PREGNANCY, URINE  TROPONIN T, HIGH SENSITIVITY    EKG: EKG Interpretation Date/Time:  Tuesday May 20 2024 15:13:37 EDT Ventricular Rate:  67 PR Interval:  181 QRS Duration:  92 QT Interval:  407 QTC Calculation: 430 R Axis:   56  Text Interpretation: Sinus rhythm RSR' in V1 or V2, right VCD or RVH No  previous ECGs available Confirmed by Geraldene Hamilton [phone]) on 05/20/2024 3:16:53 PM  Radiology: CT ABDOMEN PELVIS W CONTRAST Result Date: 05/20/2024 CLINICAL DATA:  Right lower quadrant abdominal pain. EXAM: CT ABDOMEN AND PELVIS WITH CONTRAST TECHNIQUE: Multidetector CT imaging of the abdomen and pelvis was performed using the standard protocol following bolus administration of intravenous contrast. RADIATION DOSE REDUCTION: This exam was performed according to the departmental dose-optimization program which includes automated exposure control, adjustment of the mA and/or kV according to patient size and/or use of iterative reconstruction technique. CONTRAST:  85mL OMNIPAQUE  IOHEXOL  300 MG/ML  SOLN COMPARISON:  CT abdomen/pelvis dated 06/04/2023. FINDINGS: Lower chest: No acute abnormality. Hepatobiliary:  No focal liver abnormality is seen. No gallstones, gallbladder wall thickening, or biliary dilatation. Pancreas: Unremarkable. No pancreatic ductal dilatation or surrounding inflammatory changes. Spleen: Normal in size without focal abnormality. Adrenals/Urinary Tract: Adrenal glands are unremarkable. Scarring and cortical loss in the left kidney is again noted. 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 Differences in measurement may be somewhat attributable to differing degrees of bladder distension. There is moderate fullness of the left ureter and renal collecting system, increased compared to the prior exam. Similar 3-4 mm nonobstructive stone is again noted at the inferior pole of the left kidney. Right kidney appears within normal limits. No right-sided urolithiasis or hydronephrosis. Stomach/Bowel: Stomach is within normal limits. Appendix appears normal. Small bowel and colon are grossly unremarkable. No evidence of obstruction. Vascular/Lymphatic: No significant vascular findings are present. No enlarged abdominal or pelvic lymph nodes.  Reproductive: Uterus and bilateral adnexa are unremarkable. Other: No abdominal wall hernia or abnormality. No abdominopelvic ascites. No free air. Musculoskeletal: No acute or significant osseous findings. IMPRESSION: 1. 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 Left renal cortical scarring. 2. Similar nonobstructive 3-4 mm left renal calculus. 3. No right-sided urolithiasis or hydronephrosis. Electronically Signed   By: Harrietta Sherry M.D.   On: 05/20/2024 16:58   DG Chest 2 View Result Date: 05/20/2024 CLINICAL DATA:  Near syncope. EXAM: CHEST - 2 VIEW COMPARISON:  April 02, 2007. FINDINGS: The heart size and mediastinal contours are within normal limits. Both lungs are clear. The visualized skeletal structures are unremarkable. IMPRESSION: No active cardiopulmonary disease. Electronically Signed   By: Lynwood Landy Raddle M.D.   On: 05/20/2024 16:35    Procedures   Medications Ordered in the ED  iohexol  (OMNIPAQUE ) 300 MG/ML solution 100 mL (85 mLs Intravenous Contrast Given 05/20/24 1624)                               PERC Score: 0, PERC Score Interpretation: No need for further workup, as <2% chance of PE.  If no criteria are positive and clinicians pre-test probability is <15%, PERC Rule criteria are satisfied Medical Decision Making Amount and/or Complexity of Data Reviewed Labs: ordered. Radiology: ordered.  Risk Prescription drug management.   This patient is a 23 year old female who presents to the ED for concern of multiple complaints including epigastric abdominal pain, right lower quad abdominal pain, bilateral flank pain (worse on right), nausea, vomiting, night sweats, near syncope, fatigue.  Symptoms have been worsening over the course of the last 2 months.  Seen by urgent care 3 times with no indication for emergent conditions present at that time requiring  further evaluation.  Treated with Macrobid  for UTI 2 weeks ago noting mild improvement in symptoms.  On physical exam, patient is in no acute distress, afebrile, alert and orient x 4, speaking in full sentences, nontachypneic, nontachycardic.  Notably tender over the epigastric region, right upper quadrant, right lower quadrant.  Notably she does also have CVA tenderness bilaterally, with more CVA tenderness noted to the right side.  Negative Rovsing sign, negative Murphy sign. No guarding or rebound tenderness present.  LCTAB, RRR, no murmur, no lower leg edema.  With patient's worsening symptoms including new onset of right lower quad abdominal pain as well as pain worse with food,  will obtain CT abdomen.  Will also order EKG and chest pain labs/imaging with her having a recent near syncopal episode with intermittent chest pain.  PERC negative.  UA does note mucus, leukocytes as well as many bacteria with no nitrates, will culture.  CT scan shows nonobstructive left nephrolithiasis as well as ureterocele to the left side.  With chest x-ray being unremarkable and troponin negative, low suspicion for ACS, AAS, pneumonia, PE, metabolic disturbance, sepsis.  Consulted with attending who believe that the patient could be treated with outpatient antibiotics and urology follow-up.  Will have her return to [facility] if she begins having new or worsening symptoms.  Will have patient follow-up with urology, prescribed Keflex  and Zofran  for additional nausea medication, will have patient follow-up with PCP to establish care and follow-up for near syncope as well as for persistent nausea vomiting if unresolved.  Will also prescribe Protonix  for her to use due to her having symptoms worse after food described as a burning sensation, will have her follow-up with PCP for this.  Has with no melenic stools, no hematemesis, low suspicion for upper GI bleed or ruptured ulcer.  Patient vital signs have  remained stable throughout the course of patient's time in the ED. Low suspicion for any other emergent pathology at this time. I believe this patient is safe to be discharged. Provided strict return to ER precautions. Patient expressed agreement and understanding of plan. All questions were answered.  Differential diagnoses prior to evaluation: The emergent differential diagnosis includes, but is not limited to, cholecystitis, cholangitis, pancreatitis, PUD, GUD, GERD, appendicitis, Diverticulitis, Gastritis, Enteritis, Incarcerated or Strangulated Hernia, Nephrolithiasis , Pyelonephritis, Inflammatory Bowel Disease, Mesenteric Adenitis, Gastroenteritis, Constipation, Endometriosis, Ectopic Pregnancy, Ovarian Torsion, Ruptured Ovarian Cyst, Pelvic Inflammatory Disease (PID), Tubo-Ovarian Abscess, Mittelschmerz. This is not an exhaustive differential.   Past Medical History / Co-morbidities / Social History: No chronic past medical history  Additional history: Chart reviewed. Pertinent results include:   Seen today by urgent care and was told to come to the ED for imaging.  Seen on 05/14/2024 for epigastric pain and urine leukocytes.  Noted been provided a GI cocktail and suspected to have symptoms likely secondary to GERD with benign abdominal exam.  Sent home on famotidine .  Seen prior on 05/05/2024 for acute cystitis, noted to have a 1.5 to 40-month history of progressively worsening nausea, vomiting, epigastric pain and bloating.  UA noting large leukocytes.  Urine culture noted positive for E. coli.  Provided Macrobid .   Lab Tests/Imaging studies: I personally interpreted labs/imaging and the pertinent results include:    CBC unremarkable CMP unremarkable Lipase unremarkable UA shows leukocytes, mucus, and many bacteria likely secondary to UTI. Troponin unremarkable Chest x-ray negative and unremarkable CT scan shows a left intrafascicular ureterocele measuring 3.7 cm with moderate  fullness.  Also noting a nonobstructing 3 to 4 mm left renal calculus with no urolithiasis or hydronephrosis.  I agree with the radiologist interpretation.  Cardiac monitoring: EKG obtained and interpreted by myself and attending physician which shows: Sinus rhythm with RSR' in V1-V2   Medications: I ordered medication including Keflex .  I have reviewed the patients home medicines and have made adjustments as needed.  Critical Interventions: None  Social Determinants of Health: Notes that she does not have a PCP and has not established care anywhere.  Disposition: After consideration of the diagnostic results and the patients response to treatment, I feel that the patient would benefit from discharge and treatment as above.  emergency department workup does not suggest an emergent condition requiring admission or immediate intervention beyond what has been performed at this time. The plan is: Keflex  for UTI, culture and change antibiotics as needed, follow-up with urology outpatient setting, return to the ED for any new or worsening symptoms, Zofran  for nausea, follow-up with PCP. The patient is safe for discharge and has been instructed to return immediately for worsening symptoms, change in symptoms or any other concerns.   Final diagnoses:  Ureterocele  Acute cystitis without hematuria  Nausea and vomiting, unspecified vomiting type    ED Discharge Orders          Ordered    ondansetron  (ZOFRAN ) 4 MG tablet  Every 6 hours        05/20/24 1738    cephALEXin  (KEFLEX ) 500 MG capsule  2 times daily        05/20/24 1738    pantoprazole  (PROTONIX ) 20 MG tablet  Daily        05/20/24 1738               Beola Terrall RAMAN, PA-C 05/20/24 1739    Zackowski, Scott, MD 05/21/24 [phone]

## 2024-05-20 NOTE — Discharge Instructions (Signed)
 You were seen today for nausea, vomiting, abdominal pain, near fainting, aches and chills.  Your lab work and imaging and physical exam today were reassuring that we have low suspicion for any emergent cause of your symptoms today.  However did note a possible UTI which we will continue to culture your urine.  Will prescribe Keflex  today which you are to take for the next 7 days, twice a day.  This can cause stomach upset.  Take with food to help prevent this.  I am also prescribing you a nausea medication and a medication to help lower the acidity of your stomach which may help with the current pain that you are experiencing.  Please be sure to follow-up with the PCP and establish care as you will need to have them reevaluate further use as well as to follow-up for nausea and vomiting if persistent.  If you begin to have any new or worsening symptoms, please follow-up at the [facility] for reevaluation.  Otherwise you are to follow-up with urology and schedule an appointment due to finding a ureterocele on your CT today, which can cause kidney damage in the long-term as well as is at risk for increased urinary tract infections.

## 2024-05-21 LAB — URINE CULTURE: Culture: <10000 — AB

## 2024-06-04 ENCOUNTER — Other Ambulatory Visit: Payer: Self-pay | Admitting: Urology

## 2024-06-06 NOTE — Progress Notes (Signed)
 Sent message, via epic in basket, requesting orders in epic from Careers adviser.

## 2024-06-11 NOTE — Progress Notes (Signed)
 The patient was identified using 2 approved identifiers. All issues noted in this document were discussed and addressed, Ms  Alexis Crawford voiced understanding and agreement with all preoperative instructions. The patient was emailed the surgery instructions per her request  at  [email or web address].  The patient was instructed to call our Admitting Office [phone] or [phone]) to complete their Pre-surgical Interview.   Medication Reconciliation done on 06-12-24  COVID Vaccine received:  [x]  No []  Yes Date of any COVID positive Test in last 90 days:  none  PCP - Norleen Jungling, MD Cardiologist - none  Chest x-ray - 05-20-2024  2v  Epic EKG - 05-20-2024  Epic  Stress Test -  ECHO -  Cardiac Cath -  CT Coronary Calcium score:   Bowel Prep - [x]  No  []   Yes ______  Pacemaker / ICD device [x]  No []  Yes   Spinal Cord Stimulator:[x]  No []  Yes       History of Sleep Apnea? [x]  No []  Yes   CPAP used?- [x]  No []  Yes    Patient has: [x]  NO Hx DM   []  Pre-DM   []  DM1  []   DM2 Does the patient monitor blood sugar?   [x]  N/A   []  No []  Yes   Blood Thinner / Instructions:  none Aspirin Instructions:  none  ERAS Protocol Ordered: [x]  No  []  Yes Patient is to be NPO after: MN Prior  Dental hx: []  Dentures:  [x]  N/A      []  Bridge or Partial:                   []  Loose or Damaged teeth:   Activity level: Able to walk up 2 flights of stairs without becoming significantly short of breath or having chest pain?  []  No   [x]    Yes  Patient can perform ADLs without assistance. []  No   [x]   Yes  Anesthesia review: No pertinent medical or surgical history.   Patient denies any S&S of respiratory illness or Covid - no shortness of breath, fever, cough or chest pain at PAT appointment.  Patient verbalized understanding and agreement to the Pre-Surgical Instructions that were given to them at this PAT appointment. Patient was also educated of the need to review these PAT instructions again prior  to her surgery.I reviewed the appropriate phone numbers to call if they have any and questions or concerns.

## 2024-06-12 NOTE — Patient Instructions (Signed)
 SURGICAL WAITING ROOM VISITATION Patients having surgery or a procedure may have no more than 2 support people in the waiting area - these visitors may rotate in the visitor waiting room.   If the patient needs to stay at the hospital during part of their recovery, the visitor guidelines for inpatient rooms apply.  PRE-OP VISITATION  Pre-op nurse will coordinate an appropriate time for 1 support person to accompany the patient in pre-op.  This support person may not rotate.  This visitor will be contacted when the time is appropriate for the visitor to come back in the pre-op area.  Please refer to the [facility] website for the visitor guidelines for Inpatients (after your surgery is over and you are in a regular room).  You are not required to quarantine at this time prior to your surgery. However, you must do this: Hand Hygiene often Do NOT share personal items Notify your provider if you are in close contact with someone who has COVID or you develop fever 100.4 or greater, new onset of sneezing, cough, sore throat, shortness of breath or body aches.  If you test positive for Covid or have been in contact with anyone that has tested positive in the last 10 days please notify you surgeon.    Your procedure is scheduled on:  Tuesday June 17, 2024  Report to [facility] Main Entrance: [address] entrance where the [employer] is available.   Report to admitting at:  06:45  AM  Call this number if you have any questions or problems the morning of surgery [phone]  DO NOT EAT OR DRINK ANYTHING AFTER MIDNIGHT THE NIGHT PRIOR TO YOUR SURGERY / PROCEDURE.   FOLLOW  ANY ADDITIONAL PRE OP INSTRUCTIONS YOU RECEIVED FROM YOUR SURGEON'S OFFICE!!!   Oral Hygiene is also important to reduce your risk of infection.        Remember - BRUSH YOUR TEETH THE MORNING OF SURGERY WITH YOUR REGULAR TOOTHPASTE  Do NOT smoke after Midnight the night before surgery.  STOP TAKING all Vitamins,  Herbs and supplements 1 week before your surgery.   Take ONLY these medicines the morning of surgery with A SIP OF WATER: Pantoprazole, Famotidine                  You may not have any metal on your body including hair pins, jewelry, and body piercing  Do not wear make-up, lotions, powders, perfumes or deodorant  Do not wear nail polish including gel and S&S, artificial / acrylic nails, or any other type of covering on natural nails including finger and toenails. If you have artificial nails, gel coating, etc., that needs to be removed by a nail salon, Please have this removed prior to surgery. Not doing so may mean that your surgery could be cancelled or delayed if the Surgeon or anesthesia staff feels like they are unable to monitor you safely.   Do not shave 48 hours prior to surgery to avoid nicks in your skin which may contribute to postoperative infections.   Contacts, Hearing Aids, dentures or bridgework may not be worn into surgery. DENTURES WILL BE REMOVED PRIOR TO SURGERY PLEASE DO NOT APPLY Poly grip OR ADHESIVES!!!  Patients discharged on the day of surgery will not be allowed to drive home.  Someone NEEDS to stay with you for the first 24 hours after anesthesia.  Do not bring your home medications to the hospital. The Pharmacy will dispense medications listed on your medication  list to you during your admission in the Hospital.  Please read over the following fact sheets you were given: IF YOU HAVE QUESTIONS ABOUT YOUR PRE-OP INSTRUCTIONS, PLEASE CALL [phone].   [facility] [place] - Preparing for Surgery Before surgery, you can play an important role.  Because skin is not sterile, your skin needs to be as free of germs as possible.  You can reduce the number of germs on your skin by washing with CHG (chlorahexidine gluconate) soap before surgery.  CHG is an antiseptic cleaner which kills germs and bonds with the skin to continue killing germs even after washing. Please DO NOT use  if you have an allergy to CHG or antibacterial soaps.  If your skin becomes reddened/irritated stop using the CHG and inform your nurse when you arrive at Short Stay. Do not shave (including legs and underarms) for at least 48 hours prior to the first CHG shower.  You may shave your face/neck.  Please follow these instructions carefully:  1.  Shower with CHG Soap the night before surgery and the  morning of surgery.  2.  If you choose to wash your hair, wash your hair first as usual with your normal  shampoo.  3.  After you shampoo, rinse your hair and body thoroughly to remove the shampoo.                             4.  Use CHG as you would any other liquid soap.  You can apply chg directly to the skin and wash.  Gently with a scrungie or clean washcloth.  5.  Apply the CHG Soap to your body ONLY FROM THE NECK DOWN.   Do not use on face/ open                           Wound or open sores. Avoid contact with eyes, ears mouth and genitals (private parts).                       Wash face,  Genitals (private parts) with your normal soap.             6.  Wash thoroughly, paying special attention to the area where your  surgery  will be performed.  7.  Thoroughly rinse your body with warm water  from the neck down.  8.  DO NOT shower/wash with your normal soap after using and rinsing off the CHG Soap.            9.  Pat yourself dry with a clean towel.            10.  Wear clean pajamas.            11.  Place clean sheets on your bed the night of your first shower and do not  sleep with pets.  ON THE DAY OF SURGERY : Do not apply any lotions/deodorants the morning of surgery.  Please wear clean clothes to the hospital/surgery center.    FAILURE TO FOLLOW THESE INSTRUCTIONS MAY RESULT IN THE CANCELLATION OF YOUR SURGERY  PATIENT SIGNATURE_________________________________  NURSE SIGNATURE__________________________________  ________________________________________________________________________

## 2024-06-13 ENCOUNTER — Encounter (HOSPITAL_COMMUNITY)
Admission: RE | Admit: 2024-06-13 | Discharge: 2024-06-13 | Disposition: A | Discharge status: Home / Self Care | Source: Ambulatory Visit | Attending: Urology | Admitting: Urology

## 2024-06-13 ENCOUNTER — Encounter (HOSPITAL_COMMUNITY): Payer: Self-pay

## 2024-06-13 VITALS — Ht 70.0 in | Wt 180.0 lb

## 2024-06-13 DIAGNOSIS — Z01818 Encounter for other preprocedural examination: Secondary | ICD-10-CM

## 2024-06-13 HISTORY — DX: Other specified health status: Z78.9

## 2024-06-13 HISTORY — DX: Gastro-esophageal reflux disease without esophagitis: K21.9

## 2024-06-13 HISTORY — DX: Personal history of urinary calculi: Z87.442

## 2024-06-16 NOTE — H&P (Signed)
 CC/HPI: cc: ureterocele   05/27/24: 23 yo woman comes in with recent UTI, abdominal pain and nausea. CT scan shows a left ureterocele and non-obstructing left renal calculus. Multiple urine cultures after first positive culture have been negative. She did not go to the doctor a lot as a child. She has never had surgery on her kidneys. She continues to have lower abdominal pain and nausea. UA today is normal.   06/10/24:   Alexis Crawford is here w c/o of UTI. She has been having abdominal pain radiating to her back which she feels has to be a urine infection as she has had some recurrent UTIs recently. The patient is scheduled for a left ureterocelectomy next week with Dr. Elisabeth. Her UA today is normal. She reports she has also had some nausea for which she has been taking the ondansetron . She reports chronic constipation. She has an appt to establish care with a primary care physician on Friday.     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   Tobacco Use Assessment Completed: Used Tobacco in last 30 days? Has never drank.  Drinks 1 caffeinated drink per day.    REVIEW OF SYSTEMS:    GU Review Female:   Patient denies frequent urination, hard to postpone urination, burning /pain with urination, get up at night to urinate, leakage of  urine, stream starts and stops, trouble starting your stream, have to strain to urinate, and being pregnant.  Gastrointestinal (Upper):   Patient denies nausea, vomiting, and indigestion/ heartburn.  Gastrointestinal (Lower):   Patient denies diarrhea and constipation.  Constitutional:   Patient denies fever, night sweats, weight loss, and fatigue.  Skin:   Patient denies skin rash/ lesion and itching.  Eyes:   Patient denies blurred vision and double vision.  Ears/ Nose/ Throat:   Patient denies sore throat and sinus problems.  Hematologic/Lymphatic:   Patient denies swollen glands and easy bruising.  Cardiovascular:   Patient denies leg swelling and chest pains.  Respiratory:   Patient denies cough and shortness of breath.  Endocrine:   Patient denies excessive thirst.  Musculoskeletal:   Patient denies back pain and joint pain.  Neurological:   Patient denies dizziness and headaches.  Psychologic:   Patient denies depression and anxiety.   VITAL SIGNS:      06/10/2024 03:51 PM  BP 145/81 mmHg  Pulse 82 /min   GU PHYSICAL EXAMINATION:    Breast: Symmetrical. No tenderness, no nipple discharge, no skin changes. No mass.  Digital Rectal Exam: Normal sphincter tone. No rectal mass.  External Genitalia: No hirsutism, no rash, no scarring, no cyst, no erythematous lesion, no papular lesion, no blanched lesion, no warty lesion. No edema.  Urethral Meatus: Normal size. Normal position. No discharge.  Urethra: No tenderness, no mass, no scarring. No hypermobility. No leakage.  Bladder: Normal to palpation, no tenderness, no mass, normal size.  Vagina: No atrophy, no stenosis. No rectocele. No cystocele. No enterocele.  Cervix: No inflammation, no discharge, no lesion, no tenderness, no wart.  Uterus: Normal size. Normal consistency. Normal position. No mobility. No descent.  Adnexa / Parametria: No tenderness. No adnexal mass. Normal left ovary. Normal right ovary.  Anus and Perineum: No  hemorrhoids. No anal stenosis. No rectal fissure, no anal fissure. No edema, no dimple, no perineal tenderness, no anal tenderness.   MULTI-SYSTEM PHYSICAL EXAMINATION:    Constitutional: Well-nourished. No physical deformities. Normally developed. Good grooming.  Neck: Neck symmetrical, not swollen. Normal tracheal position.  Respiratory: No labored breathing, no use of accessory muscles.   Cardiovascular: Normal temperature, normal extremity pulses, no swelling, no varicosities.  Lymphatic: No enlargement of neck, axillae, groin.  Skin: No paleness, no jaundice, no cyanosis. No lesion, no ulcer, no rash.  Neurologic / Psychiatric: Oriented to time, oriented to place, oriented to person. No depression, no anxiety, no agitation.  Gastrointestinal: No mass, no tenderness, no rigidity, non obese abdomen.  Eyes: Normal conjunctivae. Normal eyelids.  Ears, Nose, Mouth, and Throat: Left ear no scars, no lesions, no masses. Right ear no scars, no lesions, no masses. Nose no scars, no lesions, no masses. Normal hearing. Normal lips.  Musculoskeletal: Normal gait and station of head and neck.     Complexity of Data:  Source Of History:  Patient, Medical Record Summary  Records Review:   Previous Doctor Records, Previous Patient Records  Urine Test Review:   Urinalysis  X-Ray Review: C.T. Abdomen/Pelvis: Reviewed Films. Discussed With Patient.     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   [place], [place] [address]  Phone:  [phone]  Fax:  [phone]            Document Letter(s):  Created for Patient: Clinical Summary         Notes:   The patient's UA is normal today. I have sent it for culture d/t her complaints and since she is having surgery next week. She has an appt with her PCP this Friday and I encouraged her to address her abd pain at that appointment to get a work-up started and necessary referrals. Sent in phenazopyridine for symptoms though I told her I wasn't sure it would help, she could try it. I will f/u on the urine culture accordingly.

## 2024-06-17 ENCOUNTER — Ambulatory Visit (HOSPITAL_COMMUNITY): Admitting: Medical

## 2024-06-17 ENCOUNTER — Encounter (HOSPITAL_COMMUNITY)
Admission: RE | Disposition: A | Discharge status: Home / Self Care | Payer: Self-pay | Source: Home / Self Care | Attending: Urology

## 2024-06-17 ENCOUNTER — Ambulatory Visit (HOSPITAL_COMMUNITY)

## 2024-06-17 ENCOUNTER — Ambulatory Visit (HOSPITAL_COMMUNITY)
Admission: RE | Admit: 2024-06-17 | Discharge: 2024-06-17 | Disposition: A | Discharge status: Home / Self Care | Attending: Urology | Admitting: Urology

## 2024-06-17 ENCOUNTER — Other Ambulatory Visit: Payer: Self-pay

## 2024-06-17 ENCOUNTER — Ambulatory Visit (HOSPITAL_BASED_OUTPATIENT_CLINIC_OR_DEPARTMENT_OTHER): Admitting: Certified Registered Nurse Anesthetist

## 2024-06-17 ENCOUNTER — Encounter (HOSPITAL_COMMUNITY): Payer: Self-pay | Admitting: Urology

## 2024-06-17 DIAGNOSIS — N2889 Other specified disorders of kidney and ureter: Secondary | ICD-10-CM | POA: Diagnosis not present

## 2024-06-17 DIAGNOSIS — K5909 Other constipation: Secondary | ICD-10-CM | POA: Insufficient documentation

## 2024-06-17 DIAGNOSIS — Z87442 Personal history of urinary calculi: Secondary | ICD-10-CM | POA: Insufficient documentation

## 2024-06-17 DIAGNOSIS — K219 Gastro-esophageal reflux disease without esophagitis: Secondary | ICD-10-CM | POA: Diagnosis not present

## 2024-06-17 DIAGNOSIS — Q6231 Congenital ureterocele, orthotopic: Secondary | ICD-10-CM | POA: Diagnosis present

## 2024-06-17 DIAGNOSIS — Z01818 Encounter for other preprocedural examination: Secondary | ICD-10-CM

## 2024-06-17 DIAGNOSIS — Z87440 Personal history of urinary (tract) infections: Secondary | ICD-10-CM | POA: Diagnosis not present

## 2024-06-17 HISTORY — PX: CYSTOSCOPY WITH URETERAL RESECTION: SHX6747

## 2024-06-17 LAB — POCT PREGNANCY, URINE: Preg Test, Ur: NEGATIVE

## 2024-06-17 SURGERY — CYSTOSCOPY, WITH URETEROCELE INCISION OR EXCISION
Anesthesia: General | Site: Ureter | Laterality: Left

## 2024-06-17 MED ORDER — LACTATED RINGERS IV SOLN: INTRAVENOUS | Status: DC

## 2024-06-17 MED ORDER — CHLORHEXIDINE GLUCONATE 0.12 % MT SOLN
15.0000 mL | Freq: Once | OROMUCOSAL | Status: AC
  Administered 2024-06-17: 15 mL via OROMUCOSAL

## 2024-06-17 MED ORDER — DEXAMETHASONE SODIUM PHOSPHATE 10 MG/ML IJ SOLN
INTRAMUSCULAR | Status: DC | PRN
  Administered 2024-06-17: 10 mg via INTRAVENOUS

## 2024-06-17 MED ORDER — PROPOFOL 10 MG/ML IV BOLUS
INTRAVENOUS | Status: AC
  Filled 2024-06-17: qty 20

## 2024-06-17 MED ORDER — STERILE WATER FOR IRRIGATION IR SOLN
Status: DC | PRN
  Administered 2024-06-17: 3000 mL

## 2024-06-17 MED ORDER — ONDANSETRON HCL 4 MG/2ML IJ SOLN
INTRAMUSCULAR | Status: AC
  Filled 2024-06-17: qty 2

## 2024-06-17 MED ORDER — ORAL CARE MOUTH RINSE: 15.0000 mL | Freq: Once | OROMUCOSAL | Status: AC

## 2024-06-17 MED ORDER — PROPOFOL 10 MG/ML IV BOLUS
INTRAVENOUS | Status: DC | PRN
  Administered 2024-06-17: 200 mg via INTRAVENOUS
  Administered 2024-06-17: 50 mg via INTRAVENOUS

## 2024-06-17 MED ORDER — SODIUM CHLORIDE 0.9 % IR SOLN: Status: DC | PRN

## 2024-06-17 MED ORDER — CEFAZOLIN SODIUM-DEXTROSE 2-4 GM/100ML-% IV SOLN
INTRAVENOUS | Status: AC
  Filled 2024-06-17: qty 100

## 2024-06-17 MED ORDER — LIDOCAINE HCL (PF) 2 % IJ SOLN
INTRAMUSCULAR | Status: DC | PRN
Start: 2024-06-17 — End: 2024-06-17
  Administered 2024-06-17: 50 mg via INTRADERMAL

## 2024-06-17 MED ORDER — ACETAMINOPHEN 10 MG/ML IV SOLN: 1000.0000 mg | Freq: Once | INTRAVENOUS | Status: DC | PRN

## 2024-06-17 MED ORDER — CEFAZOLIN SODIUM-DEXTROSE 2-4 GM/100ML-% IV SOLN
2.0000 g | INTRAVENOUS | Status: AC
  Administered 2024-06-17: 2 g via INTRAVENOUS

## 2024-06-17 MED ORDER — DEXAMETHASONE SODIUM PHOSPHATE 10 MG/ML IJ SOLN
INTRAMUSCULAR | Status: AC
  Filled 2024-06-17: qty 1

## 2024-06-17 MED ORDER — LIDOCAINE HCL (PF) 2 % IJ SOLN
INTRAMUSCULAR | Status: AC
  Filled 2024-06-17: qty 5

## 2024-06-17 MED ORDER — MIDAZOLAM HCL 2 MG/2ML IJ SOLN
INTRAMUSCULAR | Status: DC | PRN
  Administered 2024-06-17: 1 mg via INTRAVENOUS

## 2024-06-17 MED ORDER — FENTANYL CITRATE (PF) 100 MCG/2ML IJ SOLN
INTRAMUSCULAR | Status: AC
  Filled 2024-06-17: qty 2

## 2024-06-17 MED ORDER — ONDANSETRON HCL 4 MG/2ML IJ SOLN
INTRAMUSCULAR | Status: DC | PRN
  Administered 2024-06-17: 4 mg via INTRAVENOUS

## 2024-06-17 MED ORDER — IOHEXOL 300 MG/ML  SOLN
INTRAMUSCULAR | Status: DC | PRN
  Administered 2024-06-17: 8 mL

## 2024-06-17 MED ORDER — FENTANYL CITRATE PF 50 MCG/ML IJ SOSY: 25.0000 ug | PREFILLED_SYRINGE | INTRAMUSCULAR | Status: DC | PRN

## 2024-06-17 MED ORDER — FENTANYL CITRATE (PF) 100 MCG/2ML IJ SOLN
INTRAMUSCULAR | Status: DC | PRN
  Administered 2024-06-17 (×2): 50 ug via INTRAVENOUS

## 2024-06-17 MED ORDER — MIDAZOLAM HCL 2 MG/2ML IJ SOLN
INTRAMUSCULAR | Status: AC
  Filled 2024-06-17: qty 2

## 2024-06-17 SURGICAL SUPPLY — 17 items
BAG URINE DRAIN 2000ML AR STRL (UROLOGICAL SUPPLIES) IMPLANT
BAG URO CATCHER STRL LF (MISCELLANEOUS) ×2 IMPLANT
CATH URETL OPEN 5X70 (CATHETERS) IMPLANT
CLOTH BEACON ORANGE TIMEOUT ST (SAFETY) ×2 IMPLANT
DRAPE FOOT SWITCH (DRAPES) ×2 IMPLANT
ELECT REM PT RETURN 15FT ADLT (MISCELLANEOUS) IMPLANT
ELECTRODE KNF MON 22-24F 12/30 (ELECTROSURGICAL) IMPLANT
GLOVE BIO SURGEON STRL SZ 6.5 (GLOVE) ×2 IMPLANT
GOWN STRL REUS W/ TWL LRG LVL3 (GOWN DISPOSABLE) ×2 IMPLANT
KIT TURNOVER KIT A (KITS) ×2 IMPLANT
LOOP CUT BIPOLAR 24F LRG (ELECTROSURGICAL) IMPLANT
MANIFOLD NEPTUNE II (INSTRUMENTS) ×2 IMPLANT
PACK CYSTO (CUSTOM PROCEDURE TRAY) ×2 IMPLANT
PAD PREP 24X48 CUFFED NSTRL (MISCELLANEOUS) ×2 IMPLANT
SYRINGE TOOMEY IRRIG 70ML (MISCELLANEOUS) IMPLANT
TUBING CONNECTING 10 (TUBING) ×2 IMPLANT
TUBING UROLOGY SET (TUBING) ×2 IMPLANT

## 2024-06-17 NOTE — Op Note (Signed)
 Operative Note  Preoperative diagnosis:  1.  Left ureterocele  Postoperative diagnosis: 1.  Left ureterocele  Procedure(s): 1.  Cystoscopy with incision of left ureterocele 2.  Left retrograde pyelogram  Surgeon: Valli Shank, MD  Assistants:  None  Anesthesia:  General  Complications:  None  EBL: None  Specimens: 1.  None  Drains/Catheters: 1.  None  Intraoperative findings:   Normal urethra Orthotopic right ureteral orifice Left ureterocele 3.   Left retrograde pyelogram demonstrated enlarged distal ureter to level of bladder with normal renal pelvis and no filling defects noted  Indication:  Alexis Crawford is a 23 y.o. female with recent urinary tract infection, abdominal pain and nausea found to have a left ureterocele and nonobstructing left renal calculus.  She is here today for incision of left ureterocele.  Description of procedure:  After risk and benefits of the procedure were discussed with the patient in detail, informed consent was obtained. Anesthesia was induced and antibiotics were administered. She was then repositioned in the dorsal lithotomy position and prepped and draped in the usual sterile fashion. A time out was performed.   A 21 French rigid cystoscope was placed in the urethra meatus and advanced into the bladder.  Patient was noted to have a right orthotopic ureteral orifice.  There was a large left ureterocele protruding into the bladder.  Bladder mucosa was normal without any evidence of mass or abnormality.  The cystoscope was removed and the resectoscope was then advanced into the bladder using the visual obturator.  The working element was assembled with a hot [employer].  The ureterocele was then incised on the superior lateral aspect until it was wide open and decompressed.  Once opened the ureter was clearly visible and good efflux was seen.  The resectoscope was removed.  The cystoscope was replaced and a left retrograde pyelogram  was obtained.  Findings noted above.  The patient's bladder was decompressed and she emerged from anesthesia.  She was transferred to the PACU in stable condition.  Plan: Follow-up in 4 weeks

## 2024-06-17 NOTE — Discharge Instructions (Signed)
 Cystoscopy with Incision of Ureterocele patient instructions  Following a cystoscopy, a catheter (a flexible rubber tube) is sometimes left in place to empty the bladder. This may cause some discomfort or a feeling that you need to urinate. Your doctor determines the period of time that the catheter will be left in place. You may have bloody urine for two to three days (Call your doctor if the amount of bleeding increases or does not subside).  You may pass blood clots in your urine, especially if you had a biopsy. It is not unusual to pass small blood clots and have some bloody urine a couple of weeks after your cystoscopy. Again, call your doctor if the bleeding does not subside. You may have: Dysuria (painful urination) Frequency (urinating often) Urgency (strong desire to urinate)  These symptoms are common especially if medicine is instilled into the bladder or a ureteral stent is placed. Avoiding alcohol and caffeine, such as coffee, tea, and chocolate, may help relieve these symptoms. Drink plenty of water , unless otherwise instructed. Your doctor may also prescribe an antibiotic or other medicine to reduce these symptoms.  Cystoscopy results are available soon after the procedure; biopsy results usually take two to four days. Your doctor will discuss the results of your exam with you. Before you go home, you will be given specific instructions for follow-up care. Special Instructions:   If you are going home with a catheter in place do not take a tub bath until removed by your doctor.   You may resume your normal activities.   Do not drive or operate machinery if you are taking narcotic pain medicine.   Be sure to keep all follow-up appointments with your doctor.   Call Your Doctor If: The catheter is not draining You have severe pain You are unable to urinate You have a fever over 101 You have severe bleeding

## 2024-06-17 NOTE — Interval H&P Note (Signed)
 History and Physical Interval Note:  06/17/2024 8:07 AM  Alexis Crawford  has presented today for surgery, with the diagnosis of LEFT URETEROCELE.  The various methods of treatment have been discussed with the patient and family. After consideration of risks, benefits and other options for treatment, the patient has consented to  Procedure(s) with comments: CYSTOSCOPY, WITH URETEROCELE INCISION OR EXCISION (Left) - CYSTOSCOPY WITH INCISION OF URETEROCELE as a surgical intervention.  The patient's history has been reviewed, patient examined, no change in status, stable for surgery.  I have reviewed the patient's chart and labs.  Questions were answered to the patient's satisfaction.     [name] D Yazmin Locher

## 2024-06-17 NOTE — Anesthesia Preprocedure Evaluation (Signed)
 Anesthesia Evaluation  Patient identified by MRN, date of birth, ID band Patient awake    Reviewed: Allergy & Precautions, NPO status , Patient's Chart, lab work & pertinent test results  History of Anesthesia Complications Negative for: history of anesthetic complications  Airway Mallampati: III  TM Distance: >3 FB Neck ROM: Full    Dental  (+) Teeth Intact, Dental Advisory Given   Pulmonary neg shortness of breath, neg sleep apnea, neg COPD, neg recent URI   breath sounds clear to auscultation       Cardiovascular negative cardio ROS  Rhythm:Regular     Neuro/Psych negative neurological ROS  negative psych ROS   GI/Hepatic Neg liver ROS,GERD  Medicated and Controlled,,  Endo/Other  negative endocrine ROS    Renal/GU      Musculoskeletal negative musculoskeletal ROS (+)    Abdominal   Peds  Hematology negative hematology ROS (+)   Anesthesia Other Findings   Reproductive/Obstetrics Lab Results      Component                Value               Date                      PREGTESTUR               NEGATIVE            06/17/2024                PREGSERUM                NEGATIVE            06/04/2023                                         Anesthesia Physical Anesthesia Plan  ASA: 1  Anesthesia Plan: General   Post-op Pain Management: Minimal or no pain anticipated   Induction: Intravenous  PONV Risk Score and Plan: 3 and Ondansetron , Dexamethasone  and Midazolam   Airway Management Planned: LMA  Additional Equipment: None  Intra-op Plan:   Post-operative Plan: Extubation in OR  Informed Consent: I have reviewed the patients History and Physical, chart, labs and discussed the procedure including the risks, benefits and alternatives for the proposed anesthesia with the patient or authorized representative who has indicated his/her understanding and acceptance.     Dental  advisory given  Plan Discussed with: CRNA  Anesthesia Plan Comments:         Anesthesia Quick Evaluation

## 2024-06-17 NOTE — Transfer of Care (Signed)
 Immediate Anesthesia Transfer of Care Note  Patient: Alexis Crawford  Procedure(s) Performed: Procedure(s) with comments: CYSTOSCOPY WITH LEFT URETERAL RETROGRADE, WITH URETEROCELE INCISION (Left) - CYSTOSCOPY WITH INCISION OF URETEROCELE  Patient Location: PACU  Anesthesia Type:General  Level of Consciousness: Patient easily awoken, comfortable, cooperative, following commands, responds to stimulation.   Airway & Oxygen Therapy: Patient spontaneously breathing, ventilating well, oxygen via simple oxygen mask.  Post-op Assessment: Report given to PACU RN, vital signs reviewed and stable, moving all extremities.   Post vital signs: Reviewed and stable.  Complications: No apparent anesthesia complications  Last Vitals:  Vitals Value Taken Time  BP 128/79 06/17/24 09:21  Temp    Pulse 65 06/17/24 09:23  Resp 17 06/17/24 09:23  SpO2 98 % 06/17/24 09:23  Vitals shown include unfiled device data.  Last Pain:  Vitals:   06/17/24 0719  TempSrc:   PainSc: 4       Patients Stated Pain Goal: 2 (06/17/24 0719)  Complications: No notable events documented.

## 2024-06-17 NOTE — Anesthesia Procedure Notes (Signed)
 Procedure Name: LMA Insertion Date/Time: 06/17/2024 8:52 AM  Performed by: Joshua Vernell BROCKS, CRNAPre-anesthesia Checklist: Patient identified, Emergency Drugs available, Suction available and Patient being monitored Patient Re-evaluated:Patient Re-evaluated prior to induction Oxygen Delivery Method: Circle system utilized Preoxygenation: Pre-oxygenation with 100% oxygen Induction Type: IV induction Ventilation: Mask ventilation without difficulty LMA: LMA inserted LMA Size: 4.0 Number of attempts: 1 Placement Confirmation: positive ETCO2 and breath sounds checked- equal and bilateral Tube secured with: Tape Dental Injury: Teeth and Oropharynx as per pre-operative assessment

## 2024-06-18 ENCOUNTER — Encounter (HOSPITAL_COMMUNITY): Payer: Self-pay | Admitting: Urology

## 2024-06-19 NOTE — Anesthesia Postprocedure Evaluation (Signed)
 Anesthesia Post Note  Patient: Alexis Crawford  Procedure(s) Performed: CYSTOSCOPY WITH LEFT URETERAL RETROGRADE, WITH URETEROCELE INCISION (Left: Ureter)     Patient location during evaluation: PACU Anesthesia Type: General Level of consciousness: awake and alert Pain management: pain level controlled Vital Signs Assessment: post-procedure vital signs reviewed and stable Respiratory status: spontaneous breathing, nonlabored ventilation and respiratory function stable Cardiovascular status: blood pressure returned to baseline and stable Postop Assessment: no apparent nausea or vomiting Anesthetic complications: no   No notable events documented.               Talia Hoheisel
